# Patient Record
Sex: Female | Born: 1970 | Race: White | Hispanic: No | Marital: Single | Smoking: Current every day smoker
Health system: Southern US, Community
[De-identification: ages and names within clinical notes are randomized; demographics above are authoritative.]

## PROBLEM LIST (undated history)

## (undated) HISTORY — PX: ABDOMINAL HYSTERECTOMY: SHX81

## (undated) HISTORY — PX: HERNIA REPAIR: SHX51

---

## 1999-02-13 ENCOUNTER — Emergency Department (HOSPITAL_COMMUNITY): Admission: EM | Admit: 1999-02-13 | Discharge: 1999-02-13 | Payer: Self-pay | Admitting: Emergency Medicine

## 1999-02-13 ENCOUNTER — Encounter: Payer: Self-pay | Admitting: Emergency Medicine

## 2011-08-22 ENCOUNTER — Emergency Department (INDEPENDENT_AMBULATORY_CARE_PROVIDER_SITE_OTHER): Payer: BC Managed Care – PPO

## 2011-08-22 ENCOUNTER — Emergency Department (HOSPITAL_BASED_OUTPATIENT_CLINIC_OR_DEPARTMENT_OTHER)
Admission: EM | Admit: 2011-08-22 | Discharge: 2011-08-22 | Disposition: A | Discharge status: Home / Self Care | Payer: BC Managed Care – PPO | Attending: Emergency Medicine | Admitting: Emergency Medicine

## 2011-08-22 ENCOUNTER — Encounter (HOSPITAL_BASED_OUTPATIENT_CLINIC_OR_DEPARTMENT_OTHER): Payer: Self-pay

## 2011-08-22 DIAGNOSIS — R1110 Vomiting, unspecified: Secondary | ICD-10-CM | POA: Insufficient documentation

## 2011-08-22 DIAGNOSIS — R51 Headache: Secondary | ICD-10-CM | POA: Insufficient documentation

## 2011-08-22 DIAGNOSIS — H53149 Visual discomfort, unspecified: Secondary | ICD-10-CM | POA: Insufficient documentation

## 2011-08-22 DIAGNOSIS — H539 Unspecified visual disturbance: Secondary | ICD-10-CM

## 2011-08-22 DIAGNOSIS — F17200 Nicotine dependence, unspecified, uncomplicated: Secondary | ICD-10-CM | POA: Insufficient documentation

## 2011-08-22 DIAGNOSIS — H538 Other visual disturbances: Secondary | ICD-10-CM | POA: Insufficient documentation

## 2011-08-22 NOTE — Discharge Instructions (Signed)
 Headache, General, Unknown Cause   The specific cause of your headache may not have been found today. There are many causes and types of headache. A few common ones are:   Tension headache.   Migraine.   Infections (examples: dental and sinus infections).   Bone and/or joint problems in the neck or jaw.   Depression.   Eye problems.  These headaches are not life threatening.   Headaches can sometimes be diagnosed by a patient history and a physical exam. Sometimes, lab and imaging studies (such as x-ray and/or CT scan) are used to rule out more serious problems. In some cases, a spinal tap (lumbar puncture) may be requested. There are many times when your exam and tests may be normal on the first visit even when there is a serious problem causing your headaches. Because of that, it is very important to follow up with your doctor or local clinic for further evaluation.   FINDING OUT THE RESULTS OF TESTS   If a radiology test was performed, a radiologist will review your results.   You will be contacted by the emergency department or your physician if any test results require a change in your treatment plan.   Not all test results may be available during your visit. If your test results are not back during the visit, make an appointment with your caregiver to find out the results. Do not assume everything is normal if you have not heard from your caregiver or the medical facility. It is important for you to follow up on all of your test results.  HOME CARE INSTRUCTIONS   Keep follow-up appointments with your caregiver, or any specialist referral.   Only take over-the-counter or prescription medicines for pain, discomfort, or fever as directed by your caregiver.   Biofeedback, massage, or other relaxation techniques may be helpful.   Ice packs or heat applied to the head and neck can be used. Do this three to four times per day, or as needed.   Call your doctor if you have any questions or concerns.   If you smoke, you  should quit.  SEEK MEDICAL CARE IF:   You develop problems with medications prescribed.   You do not respond to or obtain relief from medications.   You have a change from the usual headache.   You develop nausea or vomiting.  SEEK IMMEDIATE MEDICAL CARE IF:   If your headache becomes severe.   You have an unexplained oral temperature above 102 F (38.9 C), or as your caregiver suggests.   You have a stiff neck.   You have loss of vision.   You have muscular weakness.   You have loss of muscular control.   You develop severe symptoms different from your first symptoms.   You start losing your balance or have trouble walking.   You feel faint or pass out.  MAKE SURE YOU:   Understand these instructions.   Will watch your condition.   Will get help right away if you are not doing well or get worse.  Document Released: 05/19/2005 Document Revised: 05/08/2011 Document Reviewed: 01/06/2008   [facility] Patient Information 2012 [place], [place].

## 2011-08-22 NOTE — ED Provider Notes (Signed)
History     CSN: [id number]  Arrival date & time 08/22/11  2018   First MD Initiated Contact with Patient 08/22/11 2047      Chief Complaint  Patient presents with  . Headache    (Consider location/radiation/quality/duration/timing/severity/associated sxs/prior treatment) HPI Comments: Patient presents today with headache. She has a history of recurrent tension-type headaches. She typically has about 4-5 headaches per week and takes [facility] powders for these which is generally 3 times per week. Today she woke up with a dull, aching headache, which gradually got worse over the next several hours and then when she was at work had an intense worsening of the headache with pain across both sides of her head associated with vomiting and blurry vision and photophobia. She took a BC powder with this and at this point. She has no headache at all. She has some intermittent blurry vision. No nausea no headache no dizziness. She's never had a headache quite like this before although she, says she's been under a lot of stress over last couple months and does have a high caffeine intake  Patient is a 41 y.o. female presenting with headaches. The history is provided by the patient.  Headache  This is a new problem. Pertinent negatives include no fever, no shortness of breath, no nausea and no vomiting.    History reviewed. No pertinent past medical history.  Past Surgical History  Procedure Date  . Abdominal hysterectomy   . Hernia repair     No family history on file.  History  Substance Use Topics  . Smoking status: Current Everyday Smoker  . Smokeless tobacco: Not on file  . Alcohol Use: Yes    OB History    Grav Para Term Preterm Abortions TAB SAB Ect Mult Living                  Review of Systems  Constitutional: Negative for fever, chills, diaphoresis and fatigue.  HENT: Negative for congestion, rhinorrhea, sneezing, neck pain and neck stiffness.   Eyes: Positive for visual  disturbance.  Respiratory: Negative for cough, chest tightness and shortness of breath.   Cardiovascular: Negative for chest pain and leg swelling.  Gastrointestinal: Negative for nausea, vomiting, abdominal pain, diarrhea and blood in stool.  Genitourinary: Negative for frequency, hematuria, flank pain and difficulty urinating.  Musculoskeletal: Negative for back pain and arthralgias.  Skin: Negative for rash.  Neurological: Positive for headaches. Negative for dizziness, seizures, facial asymmetry, speech difficulty, weakness and numbness.  Psychiatric/Behavioral: Negative for confusion, decreased concentration and agitation.    Allergies  Review of patient's allergies indicates no known allergies.  Home Medications   Current Outpatient Rx  Name Route Sig Dispense Refill  . ARTHRITIS STRENGTH BC POWDER PO Oral Take 1 packet by mouth daily as needed. Patient used this medication for her headache.      BP 118/75  Pulse 62  Temp(Src) 97.6 F (36.4 C) (Oral)  Resp 16  Ht 5\' 4"  (1.626 m)  Wt 139 lb (63.05 kg)  BMI 23.86 kg/m2  SpO2 100%  Physical Exam  Constitutional: She is oriented to person, place, and time. She appears well-developed and well-nourished.  HENT:  Head: Normocephalic and atraumatic.  Eyes: Conjunctivae and EOM are normal. Pupils are equal, round, and reactive to light.       Normal funduscopic exam  Neck: Normal range of motion. Neck supple.       No meningeal signs  Cardiovascular: Normal rate, regular rhythm and  normal heart sounds.   Pulmonary/Chest: Effort normal and breath sounds normal. No respiratory distress. She has no wheezes. She has no rales. She exhibits no tenderness.  Abdominal: Soft. Bowel sounds are normal. There is no tenderness. There is no rebound and no guarding.  Musculoskeletal: Normal range of motion. She exhibits no edema.  Lymphadenopathy:    She has no cervical adenopathy.  Neurological: She is alert and oriented to person, place,  and time. She has normal strength. No cranial nerve deficit or sensory deficit. Coordination normal. GCS eye subscore is 4. GCS verbal subscore is 5. GCS motor subscore is 6.  Skin: Skin is warm and dry. No rash noted.  Psychiatric: She has a normal mood and affect.    ED Course  Procedures (including critical care time)  No results found for this or any previous visit. Ct Head Wo Contrast  08/22/2011  *RADIOLOGY REPORT*  Clinical Data: Headache, light sensitivity, visual disturbances, pressure behind ears  CT HEAD WITHOUT CONTRAST  Technique:  Contiguous axial images were obtained from the base of the skull through the vertex without contrast.  Comparison: None  Findings: Normal ventricular morphology. No midline shift or mass effect. Normal appearance of brain parenchyma. No intracranial hemorrhage, mass lesion, or acute infarction. Visualized paranasal sinuses and mastoid air cells clear. Bones unremarkable.  IMPRESSION: No acute intracranial abnormalities.  Original Report Authenticated By: Lollie Marrow, M.D.       1. Headache       MDM  Pt with chronic tension type headaches.  Today with sudden, worsening headache.  CT neg.   Do not feel that LP is indicated now given that pt has absolutely no pain at all now and we did not give her any meds.  She did take a BC powder earlier, but nothing else.  Given this, I feel the likelihood of meningitis or SAH is low.  I do feel that she probably has a component of rebound headaches and needs better management of her headaches.  Will refer her to neurology.        Rolan Bucco, MD 08/22/11 2207

## 2011-08-22 NOTE — ED Notes (Addendum)
Woke this afternoon 150pm with HA-works 2nd shift-increased pain while at work-took bc powder-no change in HA-was picked up at work-pain better approx 8pm-c/o cont'd light sensitivity, visual disturbance-"pressure" behind ears-states "nothing major now"-was sent by [employer]

## 2011-11-10 ENCOUNTER — Ambulatory Visit (INDEPENDENT_AMBULATORY_CARE_PROVIDER_SITE_OTHER): Payer: BC Managed Care – PPO | Admitting: Emergency Medicine

## 2011-11-10 ENCOUNTER — Ambulatory Visit: Payer: BC Managed Care – PPO

## 2011-11-10 VITALS — BP 110/77 | HR 84 | Temp 98.2°F | Resp 16 | Ht 63.25 in | Wt 137.8 lb

## 2011-11-10 DIAGNOSIS — S6710XA Crushing injury of unspecified finger(s), initial encounter: Secondary | ICD-10-CM

## 2011-11-10 DIAGNOSIS — M79609 Pain in unspecified limb: Secondary | ICD-10-CM

## 2011-11-10 DIAGNOSIS — S6700XA Crushing injury of unspecified thumb, initial encounter: Secondary | ICD-10-CM
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
## 2013-02-22 ENCOUNTER — Other Ambulatory Visit: Payer: Self-pay | Admitting: Neurosurgery

## 2013-02-22 DIAGNOSIS — M5126 Other intervertebral disc displacement, lumbar region: Secondary | ICD-10-CM

## 2013-02-25 ENCOUNTER — Ambulatory Visit
Admission: RE | Admit: 2013-02-25 | Discharge: 2013-02-25 | Disposition: A | Discharge status: Home / Self Care | Payer: Managed Care, Other (non HMO) | Source: Ambulatory Visit | Attending: Neurosurgery | Admitting: Neurosurgery

## 2013-02-25 DIAGNOSIS — M5126 Other intervertebral disc displacement, lumbar region: Secondary | ICD-10-CM

## 2013-02-25 MED ORDER — METHYLPREDNISOLONE ACETATE 40 MG/ML INJ SUSP (RADIOLOG
120.0000 mg | Freq: Once | INTRAMUSCULAR | Status: AC
  Administered 2013-02-25: 120 mg via EPIDURAL

## 2013-02-25 MED ORDER — IOHEXOL 180 MG/ML  SOLN
1.0000 mL | Freq: Once | INTRAMUSCULAR | Status: AC | PRN
  Administered 2013-02-25: 1 mL via EPIDURAL

## 2013-03-08 ENCOUNTER — Other Ambulatory Visit: Payer: Self-pay | Admitting: Neurosurgery

## 2013-03-08 ENCOUNTER — Other Ambulatory Visit: Payer: Self-pay | Admitting: Pediatrics

## 2013-03-08 DIAGNOSIS — M5126 Other intervertebral disc displacement, lumbar region: Secondary | ICD-10-CM

## 2013-03-11 ENCOUNTER — Ambulatory Visit
Admission: RE | Admit: 2013-03-11 | Discharge: 2013-03-11 | Disposition: A | Discharge status: Home / Self Care | Payer: Managed Care, Other (non HMO) | Source: Ambulatory Visit | Attending: Neurosurgery | Admitting: Neurosurgery

## 2013-03-11 DIAGNOSIS — M5126 Other intervertebral disc displacement, lumbar region: Secondary | ICD-10-CM

## 2013-03-11 MED ORDER — IOHEXOL 180 MG/ML  SOLN
1.0000 mL | Freq: Once | INTRAMUSCULAR | Status: AC | PRN
  Administered 2013-03-11: 1 mL via EPIDURAL

## 2013-03-11 MED ORDER — METHYLPREDNISOLONE ACETATE 40 MG/ML INJ SUSP (RADIOLOG
120.0000 mg | Freq: Once | INTRAMUSCULAR | Status: AC
  Administered 2013-03-11: 120 mg via EPIDURAL

## 2013-06-22 IMAGING — CT CT HEAD W/O CM
1 series · 16 of 30 positions shown, 20 images · non-contrast
Comparison: None

CLINICAL DATA: Headache, light sensitivity, visual disturbances,
pressure behind ears

CT HEAD WITHOUT CONTRAST
TECHNIQUE: Contiguous axial images were obtained from the base of
the skull through the vertex without contrast.

[Series 2: head 4.8 h37s · axial · 0.44mm/px · z∈[-131,+6]mm · 16 of 32 slices shown, 20 images]
[im 2/32  brain]
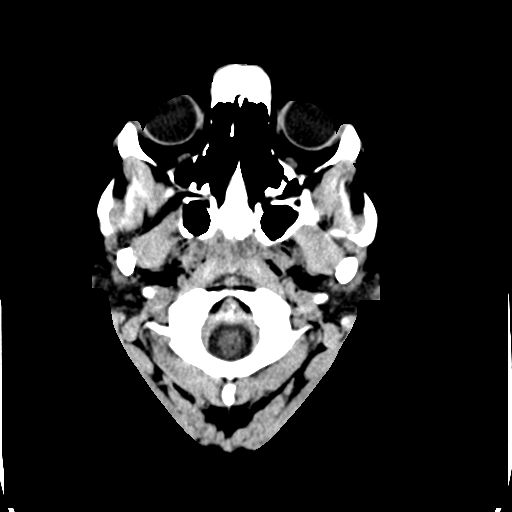
[im 2/32  bone]
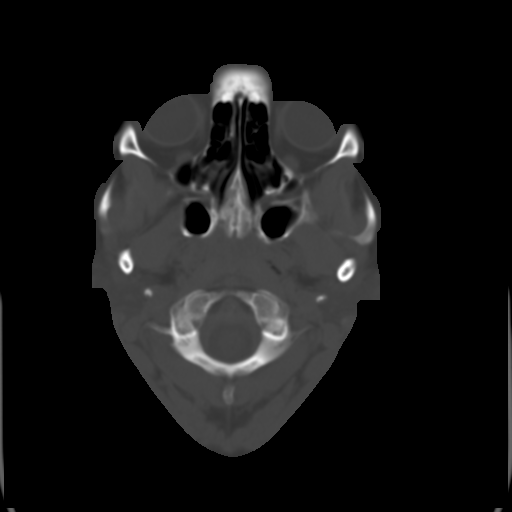
[im 4/32  brain]
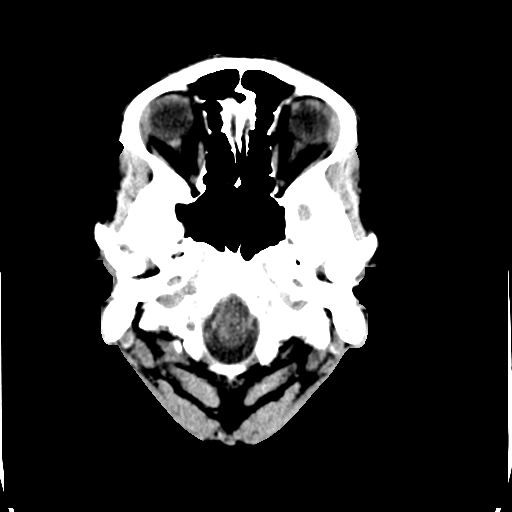
[im 6/32  brain]
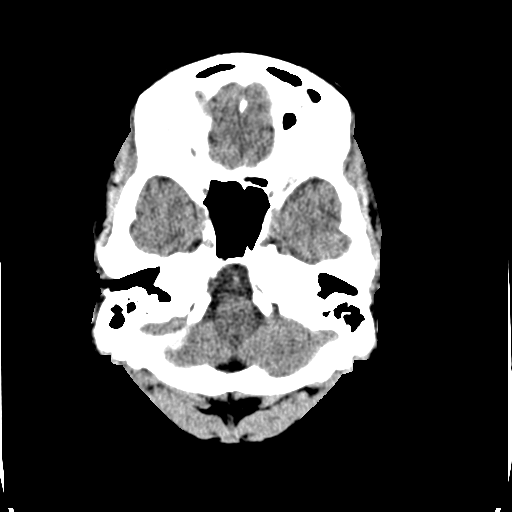
[im 8/32  brain]
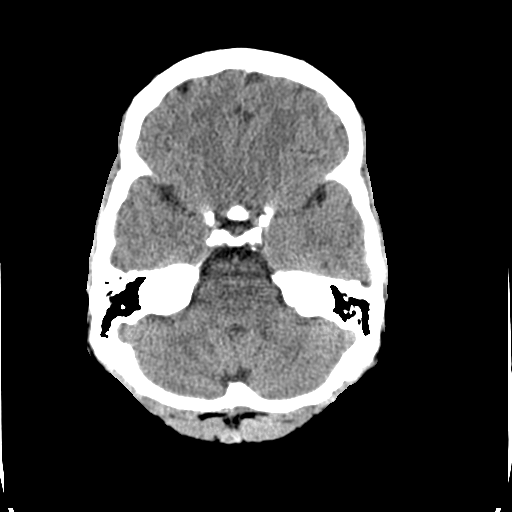
[im 9/32  brain]
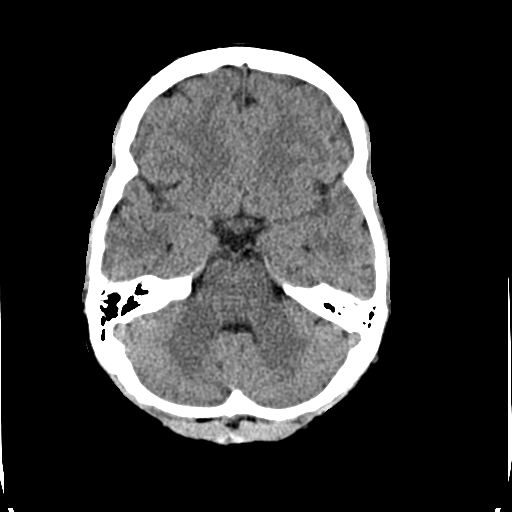
[im 9/32  bone]
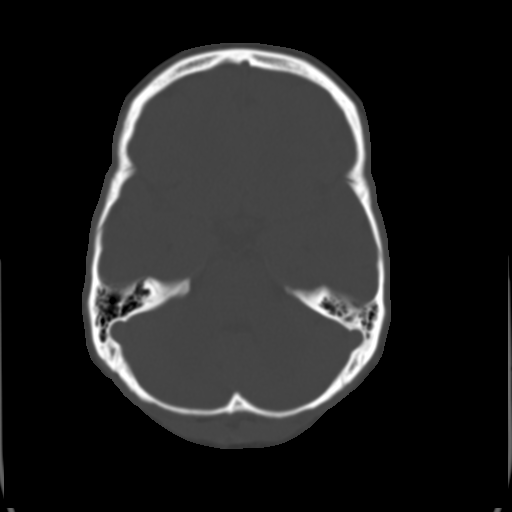
[im 11/32  brain]
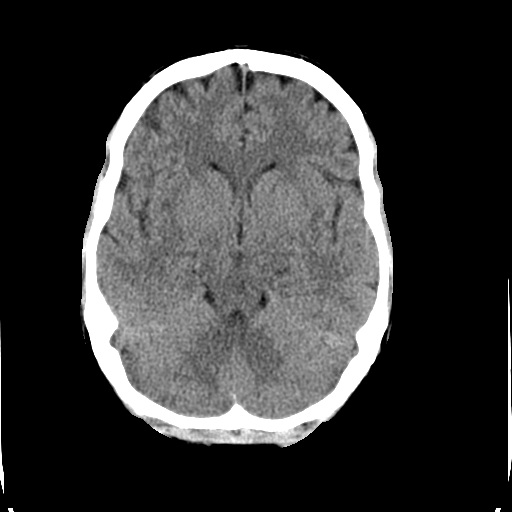
[im 13/32  brain]
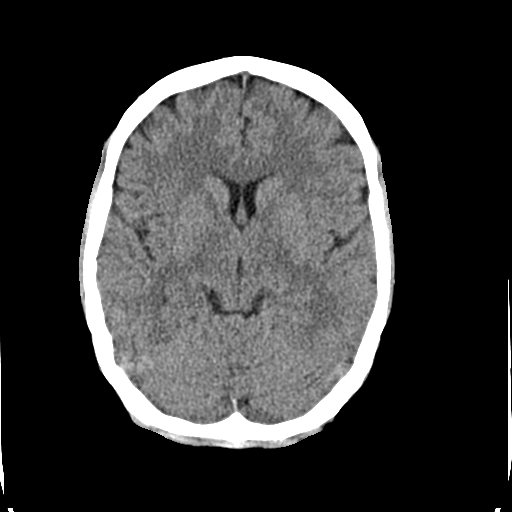
[im 15/32  brain]
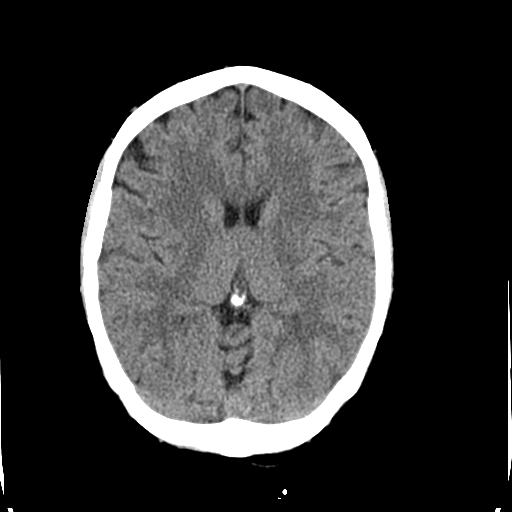
[im 17/32  brain]
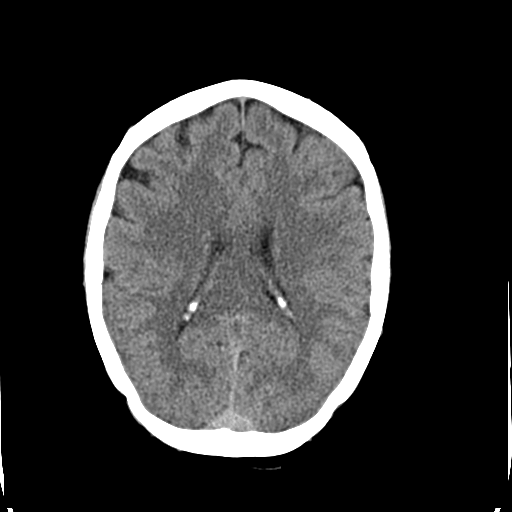
[im 17/32  bone]
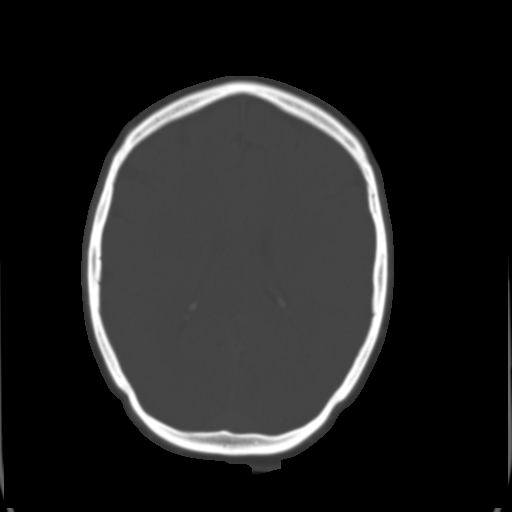
[im 19/32  brain]
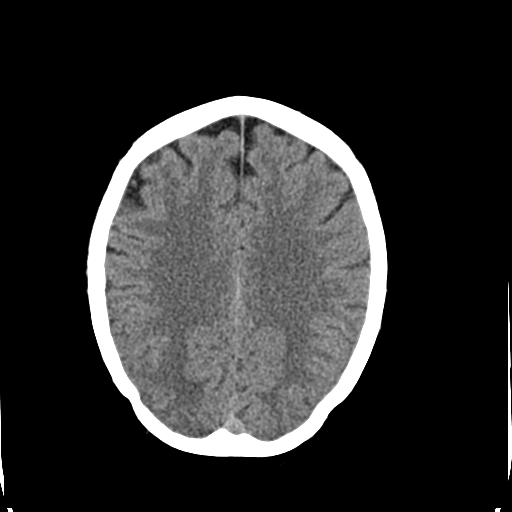
[im 21/32  brain]
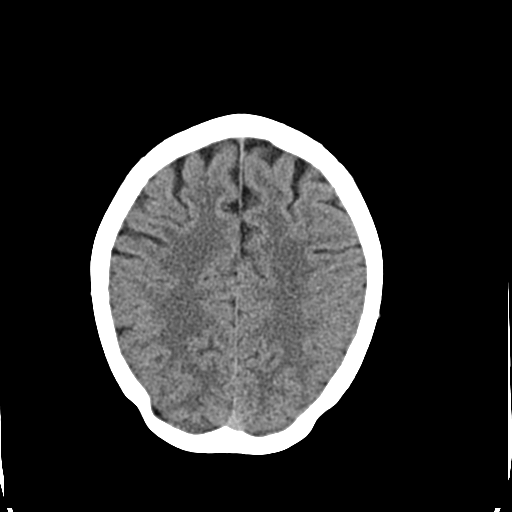
[im 23/32  brain]
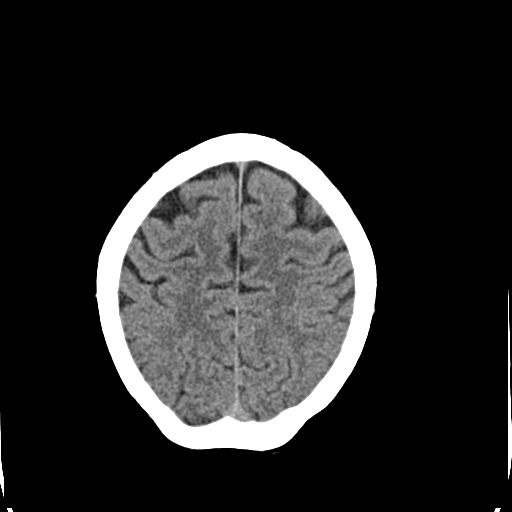
[im 24/32  brain]
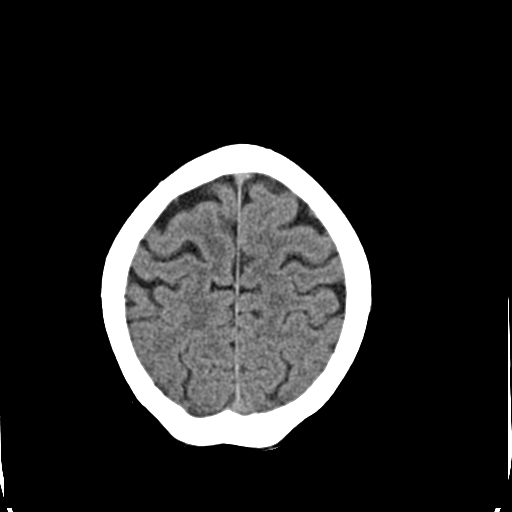
[im 24/32  bone]
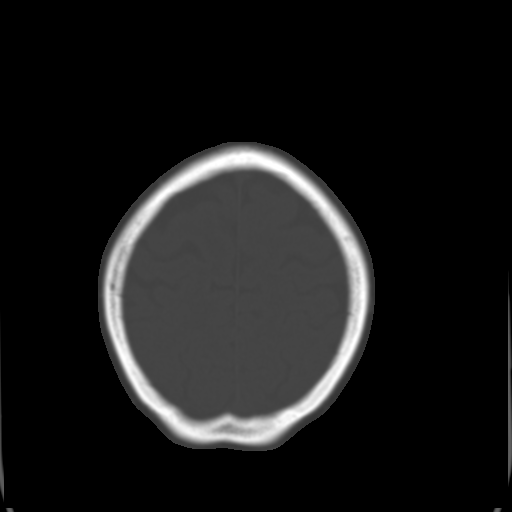
[im 26/32  brain]
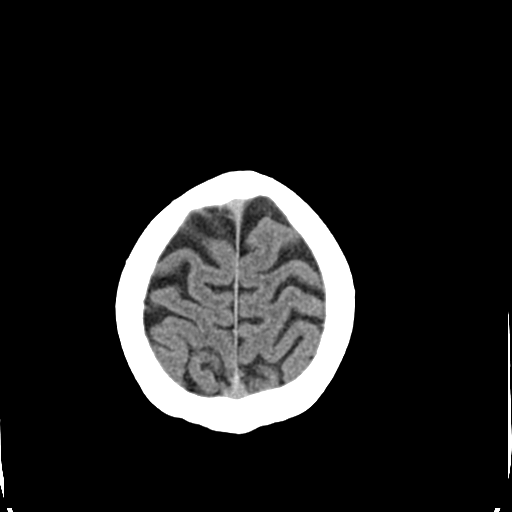
[im 28/32  brain]
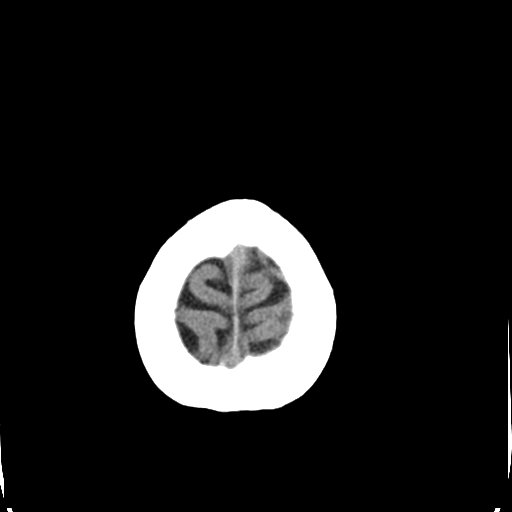
[im 30/32  brain]
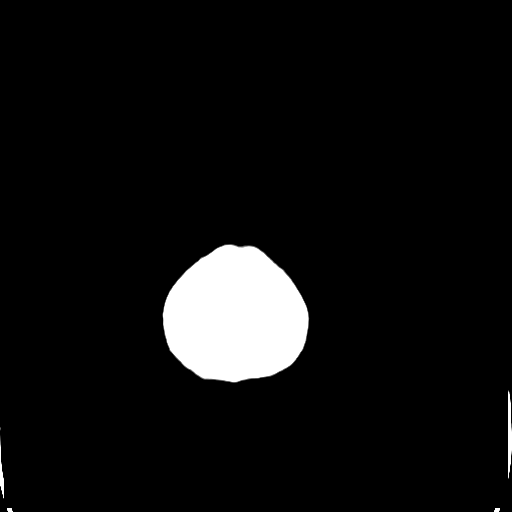

[16 of 30 positions shown; findings below may reference images not displayed]

FINDINGS: Normal ventricular morphology.
No midline shift or mass effect.
Normal appearance of brain parenchyma.
No intracranial hemorrhage, mass lesion, or acute infarction.
Visualized paranasal sinuses and mastoid air cells clear.
Bones unremarkable.
IMPRESSION: No acute intracranial abnormalities.

## 2015-08-07 ENCOUNTER — Encounter: Payer: Self-pay | Admitting: Rehabilitative and Restorative Service Providers"

## 2015-08-07 ENCOUNTER — Ambulatory Visit (INDEPENDENT_AMBULATORY_CARE_PROVIDER_SITE_OTHER): Payer: BLUE CROSS/BLUE SHIELD | Admitting: Rehabilitative and Restorative Service Providers"

## 2015-08-07 DIAGNOSIS — M2560 Stiffness of unspecified joint, not elsewhere classified: Secondary | ICD-10-CM

## 2015-08-07 DIAGNOSIS — M623 Immobility syndrome (paraplegic): Secondary | ICD-10-CM | POA: Diagnosis not present

## 2015-08-07 DIAGNOSIS — M79605 Pain in left leg: Principal | ICD-10-CM

## 2015-08-07 DIAGNOSIS — M545 Low back pain: Secondary | ICD-10-CM | POA: Diagnosis not present

## 2015-08-07 DIAGNOSIS — R531 Weakness: Secondary | ICD-10-CM

## 2015-08-07 DIAGNOSIS — Z7409 Other reduced mobility: Secondary | ICD-10-CM

## 2015-08-07 DIAGNOSIS — R29898 Other symptoms and signs involving the musculoskeletal system: Secondary | ICD-10-CM | POA: Diagnosis not present

## 2015-08-07 NOTE — Patient Instructions (Signed)
Abdominal Bracing With Pelvic Floor (Hook-Lying)    With neutral spine, tighten pelvic floor and abdominals sucking belly button to back bone; tighten muscles in back at waist. Hold 10 sec  Repeat __10_ times. Do _several__ times a day. Progress to do this in sitting; standing; walking; and with functional activities.   Trunk: Prone Extension (Press-Ups)    Lie on stomach on firm, flat surface. Relax bottom and legs. Raise chest in air with elbows straight. Keep hips flat on surface, sag stomach. Hold _2-3___ seconds. Repeat _10___ times. Do __2-4__ sessions per day. CAUTION: Movement should be gentle and slow.   Posture Tips DO: - stand tall and erect - keep chin tucked in - keep head and shoulders in alignment - check posture regularly in mirror or large window - pull head back against headrest in car seat;  Change your position often.  Sit with lumbar support. DON'T: - slouch or slump while watching TV or reading - sit, stand or lie in one position  for too long;  Sitting is especially hard on the spine so if you sit at a desk/use the computer, then stand up often!   Sleeping on Back  Place pillow under knees. A pillow with cervical support and a roll around waist are also helpful. Copyright  VHI. All rights reserved.  Sleeping on Side Place pillow between knees. Use cervical support under neck and a roll around waist as needed. Copyright  VHI. All rights reserved.   Sleeping on Stomach   If this is the only desirable sleeping position, place pillow under lower legs, and under stomach or chest as needed.  Posture - Sitting   Sit upright, head facing forward. Try using a roll to support lower back. Keep shoulders relaxed, and avoid rounded back. Keep hips level with knees. Avoid crossing legs for long periods. Stand to Sit / Sit to Stand   To sit: Bend knees to lower self onto front edge of chair, then scoot back on seat. To stand: Reverse sequence by placing one foot  forward, and scoot to front of seat. Use rocking motion to stand up.   Work Height and Reach  Ideal work height is no more than 2 to 4 inches below elbow level when standing, and at elbow level when sitting. Reaching should be limited to arm's length, with elbows slightly bent.  Bending  Bend at hips and knees, not back. Keep feet shoulder-width apart.    Posture - Standing   Good posture is important. Avoid slouching and forward head thrust. Maintain curve in low back and align ears over shoul- ders, hips over ankles.  Alternating Positions   Alternate tasks and change positions frequently to reduce fatigue and muscle tension. Take rest breaks. Computer Work   Position work to Art gallery managerface forward. Use proper work and seat height. Keep shoulders back and down, wrists straight, and elbows at right angles. Use chair that provides full back support. Add footrest and lumbar roll as needed.  Getting Into / Out of Car  Lower self onto seat, scoot back, then bring in one leg at a time. Reverse sequence to get out.  Dressing  Lie on back to pull socks or slacks over feet, or sit and bend leg while keeping back straight.    Housework - Sink  Place one foot on ledge of cabinet under sink when standing at sink for prolonged periods.   Pushing / Pulling  Pushing is preferable to pulling. Keep back in proper alignment,  and use leg muscles to do the work.  Deep Squat   Squat and lift with both arms held against upper trunk. Tighten stomach muscles without holding breath. Use smooth movements to avoid jerking.  Avoid Twisting   Avoid twisting or bending back. Pivot around using foot movements, and bend at knees if needed when reaching for articles.  Carrying Luggage   Distribute weight evenly on both sides. Use a cart whenever possible. Do not twist trunk. Move body as a unit.   Lifting Principles .Maintain proper posture and head alignment. .Slide object as close as possible  before lifting. .Move obstacles out of the way. .Test before lifting; ask for help if too heavy. .Tighten stomach muscles without holding breath. .Use smooth movements; do not jerk. .Use legs to do the work, and pivot with feet. .Distribute the work load symmetrically and close to the center of trunk. .Push instead of pull whenever possible.   Ask For Help   Ask for help and delegate to others when possible. Coordinate your movements when lifting together, and maintain the low back curve.  Log Roll   Lying on back, bend left knee and place left arm across chest. Roll all in one movement to the right. Reverse to roll to the left. Always move as one unit. Housework - Sweeping  Use long-handled equipment to avoid stooping.   Housework - Wiping  Position yourself as close as possible to reach work surface. Avoid straining your back.  Laundry - Unloading Wash   To unload small items at bottom of washer, lift leg opposite to arm being used to reach.  Gardening - Raking  Move close to area to be raked. Use arm movements to do the work. Keep back straight and avoid twisting.     Cart  When reaching into cart with one arm, lift opposite leg to keep back straight.   Getting Into / Out of Bed  Lower self to lie down on one side by raising legs and lowering head at the same time. Use arms to assist moving without twisting. Bend both knees to roll onto back if desired. To sit up, start from lying on side, and use same move-ments in reverse. Housework - Vacuuming  Hold the vacuum with arm held at side. Step back and forth to move it, keeping head up. Avoid twisting.   Laundry - Armed forces training and education officer so that bending and twisting can be avoided.   Laundry - Unloading Dryer  Squat down to reach into clothes dryer or use a reacher.  Gardening - Weeding / Psychiatric nurse or Kneel. Knee pads may be helpful.                     TENS  UNIT: This is helpful for muscle pain and spasm.   Search and Purchase a TENS 7000 2nd edition at www.tenspros.com. It should be less than $30.     TENS unit instructions: Do not shower or bathe with the unit on Turn the unit off before removing electrodes or batteries If the electrodes lose stickiness add a drop of water to the electrodes after they are disconnected from the unit and place on plastic sheet. If you continued to have difficulty, call the TENS unit company to purchase more electrodes. Do not apply lotion on the skin area prior to use. Make sure the skin is clean and dry as this will help prolong the life of the electrodes. After use, always  check skin for unusual red areas, rash or other skin difficulties. If there are any skin problems, does not apply electrodes to the same area. Never remove the electrodes from the unit by pulling the wires. Do not use the TENS unit or electrodes other than as directed. Do not change electrode placement without consultating your therapist or physician. Keep 2 fingers with between each electrode.

## 2015-08-07 NOTE — Therapy (Signed)
[facility] [place] [address] [place], [place], [address] Phone: [phone]   Fax:  [phone]  Physical Therapy Evaluation  Patient Details  Name: Catherine Hahn MRN: [id number] Date of Birth: September 19, 1970 Referring Provider: Dr. Casper Harrison  Encounter Date: 08/07/2015      PT End of Session - 08/07/15 0857    Visit Number 1   Number of Visits 12   Date for PT Re-Evaluation 09/18/15   PT Start Time 0857   PT Stop Time 1001   PT Time Calculation (min) 64 min   Activity Tolerance Patient tolerated treatment well      History reviewed. No pertinent past medical history.  Past Surgical History  Procedure Laterality Date  . Abdominal hysterectomy    . Hernia repair      There were no vitals filed for this visit.  Visit Diagnosis:  LBP radiating to left leg  Stiffness due to immobility  Weakness of left lower extremity  Decreased strength, endurance, and mobility      Subjective Assessment - 08/07/15 0858    Subjective Patient reports that she underwent lumbar laminectomy L3.4.5 11/14 with come continued pain and problems. She lost her insurance and was unable to continue rehab. She experienced recurrent pain in LB and underwent repeat MRI which showed buldging lumbar discs. Has noted pain in the LB and across the buttocks for ~ 1 month.    Pertinent History Lumbar laminectomy 11/14; hysteroctomy; MRI showed buldging discs; hernia repair with mesh -  ingunial and groin area   How long can you sit comfortably? 15 min    How long can you stand comfortably? 5-10 min    How long can you walk comfortably? 5 min    Diagnostic tests MRI and xrays    Patient Stated Goals to be able to walk and ewxercise; bend; move without pain    Currently in Pain? Yes   Pain Score 4    Pain Location Back   Pain Orientation Lower;Left   Pain Descriptors / Indicators Sharp;Stabbing;Aching   Pain Type Chronic pain   Pain Radiating Towards into  buttocks and posterior thigh to knee on Lt    Pain Onset More than a month ago   Pain Frequency Intermittent   Aggravating Factors  prolonged sitting; standing; walking:  lifting: bending    Pain Relieving Factors lying down with pillow under the back of her legs; Advil            [facility] PT Assessment - 08/07/15 0001    Assessment   Medical Diagnosis Chronic LBP    Referring Provider Dr. Casper Harrison   Onset Date/Surgical Date 07/04/15   Hand Dominance Left   Next MD Visit no return scheduled    Prior Therapy PT 2015    Precautions   Precautions None   Balance Screen   Has the patient fallen in the past 6 months No   Has the patient had a decrease in activity level because of a fear of falling?  No   Is the patient reluctant to leave their home because of a fear of falling?  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    Leisure household chores   Observation/Other Assessments   Focus on Therapeutic Outcomes (FOTO)  64% limitation  Sensation   Additional Comments WFL's per pt report    Posture/Postural Control   Posture Comments head forward; shoulders rounded and elevated; increased thoracic kyphosis; Rt hip shifted to Rt    AROM   Overall AROM Comments hips WFL's bilat    Lumbar Flexion 50%   Lumbar Extension 5%   Lumbar - Right Side Bend 15%   Lumbar - Left Side Bend 20%   Lumbar - Right Rotation 10%   Lumbar - Left Rotation 10%   Strength   Right Hip Flexion 5/5   Right Hip Extension 5/5   Right Hip ABduction 5/5   Right Hip ADduction 5/5   Left Hip Flexion 4+/5   Left Hip Extension 4+/5   Left Hip ABduction 4+/5   Left Hip ADduction --  5-/5   Right/Left Knee --  5/5 bilat knee flex/ext    Right/Left Ankle --  5/5 DF/PF bilat    Flexibility   Hamstrings Rt 75 deg; Lt 70 deg   Quadriceps heel ~  4 inches form buttock pull Lt quad; pull Lt groin on Rt    ITB tight bilat Lt > Rt    Piriformis tight Lt    Palpation   Spinal mobility pain with CPA L3/4/5; Lt UPA L 3/4/5; Rt L4    Palpation comment muscular tightness through Lt paraspinals; QL; lats; glut medius; piriformis                    [facility] Adult PT Treatment/Exercise - 08/07/15 0001    Self-Care   Self-Care --  transitional movements sit to stand and reverse   Lumbar Exercises: Stretches   Press Ups --  2-3 sec hold x 10   Lumbar Exercises: Supine   Ab Set --  3 part core 10 sec x 10 fatigued quickly    Moist Heat Therapy   Number Minutes Moist Heat 15 Minutes   Moist Heat Location Lumbar Spine;Hip  Lt   Programme researcher, broadcasting/film/video Location bilat L4/5 Lt hip/buttock    Electrical Stimulation Action IFC   Electrical Stimulation Parameters to tolerance   Electrical Stimulation Goals Pain;Tone                PT Education - 08/07/15 [phone]    Education provided Yes   Education Details initiated spine care education; HEP; TENS info    Person(s) Educated Patient   Methods Explanation;Demonstration;Tactile cues;Verbal cues;Handout   Comprehension Verbalized understanding;Returned demonstration;Verbal cues required;Tactile cues required             PT Long Term Goals - 08/07/15 1028    PT LONG TERM GOAL #1   Title Improve trunk mobilty and ROM to 70% of normal range throughout 09/18/15   Time 6   Period Weeks   Status New   PT LONG TERM GOAL #2   Title Increased LE strength to 4+/5 to 5/5 throughout 09/18/15   Time 6   Period Weeks   Status New   PT LONG TERM GOAL #3   Title Increase functional activity tolerance allowing pt to sit/stand/walk 30-60 min without increased symptoms 09/18/15   Time 6   Period Weeks   Status New   PT LONG TERM GOAL #4   Title I in HEP 09/18/15   Time 6   Period Weeks   Status New   PT LONG TERM GOAL #5   Title Improve FOTO to </= 41%  limitation 09/18/15   Time 6  Period Weeks   Status New               Plan - 08/07/15 1016    Clinical Impression Statement Catherine Hahn presents with history of chronic LBP s/plumbar laminectomy 2014 with recurrent flare up of symptoms in 2015-16. Most recent exacerbation ~ 1 month ago. She has limited trunk mobility and ROM; muscular tighenss to paipation; decreased strength; poor core stability; poor posture and body mechanics; limited functional activity endurance and tolerance. pt will benefit form PT to address deficits identified.     Pt will benefit from skilled therapeutic intervention in order to improve on the following deficits Improper body mechanics;Postural dysfunction;Increased fascial restricitons;Decreased range of motion;Decreased mobility;Decreased strength;Decreased endurance;Decreased activity tolerance;Pain   Rehab Potential Good   PT Frequency 2x / week   PT Duration 6 weeks   PT Treatment/Interventions Patient/family education;ADLs/Self Care Home Management;Therapeutic activities;Therapeutic exercise;Neuromuscular re-education;Manual techniques;Dry needling;Electrical Stimulation;Cryotherapy;Moist Heat;Ultrasound   PT Next Visit Plan hamstring and piriformis stretching; core stabilization; manual work including spinal mobs lumbar; modalities as indicated; consider TDN as indicated; continue spine care education    PT Home Exercise Plan spine care initiated; HEP; TENS unit info   Consulted and Agree with Plan of Care Patient         Problem List There are no active problems to display for this patient.   Niels Cranshaw Rober MinionP Keishia Ground PT, MPH  08/07/2015, 10:39 AM  [facility] [address] [place], [place], [address] Phone: [phone]   Fax:  [phone]  Name: Catherine Hahn MRN: [id number] Date of Birth: 07/10/1970

## 2015-08-10 ENCOUNTER — Encounter: Payer: Managed Care, Other (non HMO) | Admitting: Rehabilitative and Restorative Service Providers"

## 2015-08-14 ENCOUNTER — Ambulatory Visit (INDEPENDENT_AMBULATORY_CARE_PROVIDER_SITE_OTHER): Payer: BLUE CROSS/BLUE SHIELD | Admitting: Rehabilitative and Restorative Service Providers"

## 2015-08-14 ENCOUNTER — Encounter: Payer: Self-pay | Admitting: Rehabilitative and Restorative Service Providers"

## 2015-08-14 DIAGNOSIS — R531 Weakness: Secondary | ICD-10-CM

## 2015-08-14 DIAGNOSIS — M545 Low back pain, unspecified: Secondary | ICD-10-CM

## 2015-08-14 DIAGNOSIS — M79605 Pain in left leg: Principal | ICD-10-CM

## 2015-08-14 DIAGNOSIS — M623 Immobility syndrome (paraplegic): Secondary | ICD-10-CM | POA: Diagnosis not present

## 2015-08-14 DIAGNOSIS — R29898 Other symptoms and signs involving the musculoskeletal system: Secondary | ICD-10-CM

## 2015-08-14 DIAGNOSIS — M2560 Stiffness of unspecified joint, not elsewhere classified: Secondary | ICD-10-CM

## 2015-08-14 DIAGNOSIS — Z7409 Other reduced mobility: Secondary | ICD-10-CM

## 2015-08-14 NOTE — Patient Instructions (Addendum)
KNEE: Quadriceps - Prone    Place strap around ankle. Bring ankle toward buttocks. Press hip into surface. Hold _30__ seconds. _3_ reps per set, __2-3_ times per day   HIP: Hamstrings - Supine    Place strap around foot. Raise leg up,bend opposite knee. Hold __30_ seconds. __3_ reps per set, __2-3_ times per day    TENS UNIT: This is helpful for muscle pain and spasm.   Search and Purchase a TENS 7000 2nd edition at www.tenspros.com. It should be less than $30.     TENS unit instructions: Do not shower or bathe with the unit on Turn the unit off before removing electrodes or batteries If the electrodes lose stickiness add a drop of water to the electrodes after they are disconnected from the unit and place on plastic sheet. If you continued to have difficulty, call the TENS unit company to purchase more electrodes. Do not apply lotion on the skin area prior to use. Make sure the skin is clean and dry as this will help prolong the life of the electrodes. After use, always check skin for unusual red areas, rash or other skin difficulties. If there are any skin problems, does not apply electrodes to the same area. Never remove the electrodes from the unit by pulling the wires. Do not use the TENS unit or electrodes other than as directed. Do not change electrode placement without consultating your therapist or physician. Keep 2 fingers with between each electrode.

## 2015-08-14 NOTE — Therapy (Signed)
[facility] [place] [address] [place], [place], [address] Phone: [phone]   Fax:  [phone]  Physical Therapy Treatment  Patient Details  Name: ELBIA PARO MRN: [id number] Date of Birth: [birth year] Referring Provider: Dr. Casper Harrison  Encounter Date: 08/14/2015      PT End of Session - 08/14/15 1016    Visit Number 2   Number of Visits 12   Date for PT Re-Evaluation 09/18/15   PT Start Time 1016   PT Stop Time 1112   PT Time Calculation (min) 56 min   Activity Tolerance Patient tolerated treatment well      History reviewed. No pertinent past medical history.  Past Surgical History  Procedure Laterality Date  . Abdominal hysterectomy    . Hernia repair      There were no vitals filed for this visit.  Visit Diagnosis:  LBP radiating to left leg  Stiffness due to immobility  Weakness of left lower extremity  Decreased strength, endurance, and mobility      Subjective Assessment - 08/14/15 1016    Subjective Patoient reports that the pain in the LB and buttocks has not changed. She is doing her exercises at home. Pain is always on the Lt in the LB and goes into the Lt buttocks   Currently in Pain? Yes   Pain Score 3    Pain Orientation Lower;Left   Pain Descriptors / Indicators Nagging   Pain Type Chronic pain   Pain Onset More than a month ago   Pain Frequency Intermittent                         [facility] Adult PT Treatment/Exercise - 08/14/15 0001    Self-Care   Self-Care --  transitional movements sit to stand and reverse   Lumbar Exercises: Stretches   Passive Hamstring Stretch 3 reps;30 seconds   Press Ups --  2-3 sec hold x 10   Quad Stretch 3 reps;30 seconds  prone with strap    Lumbar Exercises: Aerobic   Stationary Bike Nustep L5 x 5 min    Lumbar Exercises: Supine   Ab Set --  3 part core 10 sec x 10 fatigued quickly    Moist Heat Therapy   Number Minutes Moist Heat 15  Minutes   Moist Heat Location Lumbar Spine;Hip  Lt   Programme researcher, broadcasting/film/video Location bilat L4/5 Lt hip/buttock    Electrical Stimulation Action IFC   Electrical Stimulation Parameters to tolerance   Electrical Stimulation Goals Pain;Tone   Manual Therapy   Manual therapy comments pt prone with pillow under trunk    Joint Mobilization Grade I/II Lumbar CPA    Soft tissue mobilization lumbar-thoracic musculature bilat Lt > Rt    Myofascial Release lumbar; Lt thoraco-lumbar                 PT Education - 08/14/15 1100    Education provided Yes   Education Details HEP    Person(s) Educated Patient   Methods Explanation;Demonstration;Tactile cues;Verbal cues;Handout   Comprehension Verbalized understanding;Returned demonstration;Verbal cues required;Tactile cues required             PT Long Term Goals - 08/14/15 1306    PT LONG TERM GOAL #1   Title Improve trunk mobilty and ROM to 70% of normal range throughout 09/18/15   Time 6   Period Weeks   Status On-going   PT LONG TERM  GOAL #2   Title Increased LE strength to 4+/5 to 5/5 throughout 09/18/15   Time 6   Period Weeks   Status On-going   PT LONG TERM GOAL #3   Title Increase functional activity tolerance allowing pt to sit/stand/walk 30-60 min without increased symptoms 09/18/15   Time 6   Period Weeks   Status On-going   PT LONG TERM GOAL #4   Title I in HEP 09/18/15   Time 6   Period Weeks   Status On-going   PT LONG TERM GOAL #5   Title Improve FOTO to </= 41% limitation 09/18/15   Time 6   Period Weeks   Status On-going               Plan - 08/14/15 1301    Clinical Impression Statement Misty StanleyLisa experienced some increased pain with exercise but did tolerate them during treatment. She responded well to manual work. Misty StanleyLisa will check on ordereing TENS unit today. Experiences good relief with estim in the clinic. Pt needs continued stretching to address tightness through hips and  LE's as well as core stabilization. No goals accomplished. Second visit.    Pt will benefit from skilled therapeutic intervention in order to improve on the following deficits Improper body mechanics;Postural dysfunction;Increased fascial restricitons;Decreased range of motion;Decreased mobility;Decreased strength;Decreased endurance;Decreased activity tolerance;Pain   Rehab Potential Good   PT Frequency 2x / week   PT Duration 6 weeks   PT Treatment/Interventions Patient/family education;ADLs/Self Care Home Management;Therapeutic activities;Therapeutic exercise;Neuromuscular re-education;Manual techniques;Dry needling;Electrical Stimulation;Cryotherapy;Moist Heat;Ultrasound   PT Next Visit Plan hamstring and piriformis stretching; core stabilization; manual work including spinal mobs lumbar; modalities as indicated; consider TDN as indicated; continue spine care education    PT Home Exercise Plan spine care initiated; HEP; TENS unit info   Consulted and Agree with Plan of Care Patient        Problem List There are no active problems to display for this patient.   Caydyn Sprung Rober MinionP Karmina Zufall PT, MPH  08/14/2015, 1:08 PM  [facility] [address] [place], [place], [address] Phone: [phone]   Fax:  [phone]  Name: Ardyth ManLisa A Silvester MRN: [id number] Date of Birth: 11/10/1970

## 2015-08-17 ENCOUNTER — Encounter: Payer: Managed Care, Other (non HMO) | Admitting: Physical Therapy

## 2015-08-21 ENCOUNTER — Ambulatory Visit (INDEPENDENT_AMBULATORY_CARE_PROVIDER_SITE_OTHER): Payer: BLUE CROSS/BLUE SHIELD | Admitting: Rehabilitative and Restorative Service Providers"

## 2015-08-21 DIAGNOSIS — M545 Low back pain: Secondary | ICD-10-CM

## 2015-08-21 DIAGNOSIS — M2560 Stiffness of unspecified joint, not elsewhere classified: Secondary | ICD-10-CM

## 2015-08-21 DIAGNOSIS — R29898 Other symptoms and signs involving the musculoskeletal system: Secondary | ICD-10-CM | POA: Diagnosis not present

## 2015-08-21 DIAGNOSIS — Z7409 Other reduced mobility: Secondary | ICD-10-CM | POA: Diagnosis not present

## 2015-08-21 DIAGNOSIS — R531 Weakness: Secondary | ICD-10-CM

## 2015-08-21 DIAGNOSIS — M623 Immobility syndrome (paraplegic): Secondary | ICD-10-CM

## 2015-08-21 DIAGNOSIS — M79605 Pain in left leg: Principal | ICD-10-CM

## 2015-08-21 NOTE — Therapy (Signed)
[facility] [place] [address] [place], [place], [address] Phone: [phone]   Fax:  [phone]  Physical Therapy Treatment  Patient Details  Name: Catherine Hahn A Jaffer MRN: [id number] Date of Birth: 04/28/1971 Referring Provider: Dr. Casper HarrisonStreet  Encounter Date: 08/21/2015      PT End of Session - 08/21/15 1022    Visit Number 3   Number of Visits 12   Date for PT Re-Evaluation 09/18/15   PT Start Time 1015   PT Stop Time 1112   PT Time Calculation (min) 57 min   Activity Tolerance Patient tolerated treatment well      No past medical history on file.  Past Surgical History  Procedure Laterality Date  . Abdominal hysterectomy    . Hernia repair      There were no vitals filed for this visit.  Visit Diagnosis:  LBP radiating to left leg  Stiffness due to immobility  Weakness of left lower extremity  Decreased strength, endurance, and mobility      Subjective Assessment - 08/21/15 1021    Subjective patient reports some improvement since last visit. She has really been working on her posture at home and at work and working on how she moves and the positionshe rests in - using pillows to support LE.    Currently in Pain? Yes   Pain Score 2    Pain Location Back   Pain Orientation Lower;Left   Pain Descriptors / Indicators Nagging   Pain Type Chronic pain   Pain Onset More than a month ago                         [facility] Treatment/Exercise - 08/21/15 0001    Self-Care   Self-Care --  transitional movements sit to stand and reverse   Lumbar Exercises: Stretches   Passive Hamstring Stretch 3 reps;30 seconds   Press Ups --  2-3 sec hold x 10   Quad Stretch 3 reps;30 seconds  prone with strap    ITB Stretch 2 reps;30 seconds   Piriformis Stretch 2 reps;30 seconds   Lumbar Exercises: Aerobic   Stationary Bike Nustep L5 x 5 min    Lumbar Exercises: Supine   Ab Set --  3 part core 10 sec x  10 fatigued quickly    Bridge 5 reps  10 sec hold    Moist Heat Therapy   Number Minutes Moist Heat 15 Minutes   Moist Heat Location Lumbar Spine;Hip  Lt   Programme researcher, broadcasting/film/videolectrical Stimulation   Electrical Stimulation Location bilat L4/5 Lt hip/buttock    Electrical Stimulation Action IFC   Electrical Stimulation Parameters to tolerance   Electrical Stimulation Goals Pain;Tone   Manual Therapy   Manual therapy comments pt prone with pillow under trunk    Joint Mobilization Grade I/II Lumbar CPA    Soft tissue mobilization lumbar-thoracic musculature bilat Lt > Rt    Myofascial Release lumbar; Lt thoraco-lumbar                      PT Long Term Goals - 08/21/15 1023    PT LONG TERM GOAL #1   Title Improve trunk mobilty and ROM to 70% of normal range throughout 09/18/15   Time 6   Period Weeks   Status On-going   PT LONG TERM GOAL #2   Title Increased LE strength to 4+/5 to 5/5 throughout 09/18/15   Time 6  Period Weeks   Status On-going   PT LONG TERM GOAL #3   Title Increase functional activity tolerance allowing pt to sit/stand/walk 30-60 min without increased symptoms 09/18/15   Time 6   Period Weeks   Status On-going   PT LONG TERM GOAL #4   Title I in HEP 09/18/15   Time 6   Period Weeks   Status On-going   PT LONG TERM GOAL #5   Title Improve FOTO to </= 41% limitation 09/18/15   Time 6   Period Weeks   Status On-going               Plan - 08/21/15 1035    Clinical Impression Statement Jyll reports some improvement in LBP with improved posture and alignment. She is trying to be conscious of positions and transitional movement. She tolerated additional exercise without significant difficulty. Still has not aquired a TENS unit but is working on that. Gradual porgress toward goals of therapy.    Pt will benefit from skilled therapeutic intervention in order to improve on the following deficits Improper body mechanics;Postural dysfunction;Increased fascial  restricitons;Decreased range of motion;Decreased mobility;Decreased strength;Decreased endurance;Decreased activity tolerance;Pain   Rehab Potential Good   PT Frequency 2x / week   PT Duration 6 weeks   PT Treatment/Interventions Patient/family education;ADLs/Self Care Home Management;Therapeutic activities;Therapeutic exercise;Neuromuscular re-education;Manual techniques;Dry needling;Electrical Stimulation;Cryotherapy;Moist Heat;Ultrasound   PT Next Visit Plan hamstring and piriformis stretching; core stabilization; manual work including spinal mobs lumbar; modalities as indicated; consider TDN as indicated; continue spine care education    PT Home Exercise Plan spine care; HEP; TENS unit info   Consulted and Agree with Plan of Care Patient        Problem List There are no active problems to display for this patient.   Celyn Rober Minion PT,  MPH  08/21/2015, 10:59 AM  [facility] [address] [place], [place], [address] Phone: [phone]   Fax:  [phone]  Name: BETRICE WANAT MRN: [id number] Date of Birth: [birth year]

## 2015-08-21 NOTE — Patient Instructions (Signed)
Piriformis Stretch    Lying on back, pull right knee toward opposite shoulder. Hold __30__ seconds. Repeat __3__ times. Do _2-3___ sessions per day.    Outer Hip Stretch: Reclined IT Band Stretch (Strap)    Strap around opposite foot, pull across only as far as possible with shoulders on mat. Hold for _30 sec Repeat _3  times each leg. 2 times/day   Bridging    Slowly raise buttocks from floor, keeping stomach tight. Repeat __10__ times per set. Do _1-3___ sets per session. Do __2__ sessions per day.

## 2015-08-24 ENCOUNTER — Encounter: Payer: Self-pay | Admitting: Rehabilitative and Restorative Service Providers"

## 2015-08-24 ENCOUNTER — Ambulatory Visit (INDEPENDENT_AMBULATORY_CARE_PROVIDER_SITE_OTHER): Payer: Managed Care, Other (non HMO) | Admitting: Rehabilitative and Restorative Service Providers"

## 2015-08-24 DIAGNOSIS — M623 Immobility syndrome (paraplegic): Secondary | ICD-10-CM | POA: Diagnosis not present

## 2015-08-24 DIAGNOSIS — Z7409 Other reduced mobility: Secondary | ICD-10-CM

## 2015-08-24 DIAGNOSIS — R6889 Other general symptoms and signs: Secondary | ICD-10-CM

## 2015-08-24 DIAGNOSIS — R29898 Other symptoms and signs involving the musculoskeletal system: Secondary | ICD-10-CM | POA: Diagnosis not present

## 2015-08-24 DIAGNOSIS — M79605 Pain in left leg: Principal | ICD-10-CM

## 2015-08-24 DIAGNOSIS — M545 Low back pain: Secondary | ICD-10-CM | POA: Diagnosis not present

## 2015-08-24 DIAGNOSIS — R531 Weakness: Secondary | ICD-10-CM

## 2015-08-24 DIAGNOSIS — M2560 Stiffness of unspecified joint, not elsewhere classified: Secondary | ICD-10-CM

## 2015-08-24 NOTE — Patient Instructions (Signed)
Combination (Hook-Lying)    Tighten stomach and slowly raise left leg and lower opposite arm over head. Keep trunk rigid. Repeat __10__ times per set. Do __1-2__ sets per session. Do _1-2___ sessions per day.   Resisted External Rotation: in Neutral - Bilateral   PALMS UP Sit or stand, tubing in both hands, elbows at sides, bent to 90, forearms forward. Pinch shoulder blades together and rotate forearms out. Keep elbows at sides. Repeat __10__ times per set. Do _2-3___ sets per session. Do _2-3___ sessions per day.   Low Row: Standing   Face anchor, feet shoulder width apart. Palms up, pull arms back, squeezing shoulder blades together. Repeat 10__ times per set. Do 2-3__ sets per session. Do 2-3__ sessions per week. Anchor Height: Waist   Strengthening: Resisted Extension   Hold tubing in right hand, arm forward. Pull arm back, elbow straight. Repeat _10___ times per set. Do 2-3____ sets per session. Do 2-3____ sessions per day.

## 2015-08-24 NOTE — Therapy (Signed)
[facility] [place] [address] [place], [place], [address] Phone: [phone]   Fax:  [phone]  Physical Therapy Treatment  Patient Details  Name: Catherine Hahn MRN: [id number] Date of Birth: 05/21/1971 Referring Provider: Dr. Casper Harrison  Encounter Date: 08/24/2015      PT End of Session - 08/24/15 1023    Visit Number 4   Number of Visits 12   Date for PT Re-Evaluation 09/18/15   PT Start Time 1017   PT Stop Time 1115   PT Time Calculation (min) 58 min   Activity Tolerance Patient limited by pain      History reviewed. No pertinent past medical history.  Past Surgical History  Procedure Laterality Date  . Abdominal hysterectomy    . Hernia repair      There were no vitals filed for this visit.  Visit Diagnosis:  LBP radiating to left leg  Stiffness due to immobility  Weakness of left lower extremity  Decreased strength, endurance, and mobility      Subjective Assessment - 08/24/15 1021    Subjective Patient reports that she is improving. She has had some soreness from exercises but notices less pain. She has been doing her exercises at home    Currently in Pain? Yes   Pain Score 1    Pain Location Back   Pain Orientation Lower;Left   Pain Descriptors / Indicators Nagging   Pain Type Chronic pain   Pain Onset More than a month ago   Pain Frequency Intermittent                         [facility] Adult PT Treatment/Exercise - 08/24/15 0001    Lumbar Exercises: Stretches   Passive Hamstring Stretch 3 reps;30 seconds   Press Ups --  2-3 sec hold x 7 increased range   Quad Stretch 3 reps;30 seconds  prone with strap    ITB Stretch 2 reps;30 seconds   Piriformis Stretch 2 reps;30 seconds   Lumbar Exercises: Aerobic   Stationary Bike Nustep L5 x 5 min    Lumbar Exercises: Standing   Row Strengthening;Right;Left;10 reps;Theraband   Theraband Level (Row) Level 2 (Red)   Shoulder Extension  Strengthening;Right;Left;10 reps;Theraband   Other Standing Lumbar Exercises scapular retraction with noodle x10 red TB    Lumbar Exercises: Supine   Ab Set --  3 part core 10 sec x 10 fatigued quickly    Bridge 5 reps  10 sec hold    Other Supine Lumbar Exercises dead bug w/ core engaged alt arm/leg x 10    Moist Heat Therapy   Number Minutes Moist Heat 15 Minutes   Moist Heat Location Lumbar Spine;Hip  Lt   Programme researcher, broadcasting/film/video Location bilat L4/5 Lt hip/buttock    Electrical Stimulation Action IFC   Electrical Stimulation Parameters to tolerance   Electrical Stimulation Goals Pain;Tone   Manual Therapy   Manual therapy comments pt prone with pillow under trunk    Joint Mobilization Grade I/II Lumbar CPA    Soft tissue mobilization lumbar-thoracic musculature bilat Lt > Rt    Myofascial Release lumbar; Lt thoraco-lumbar                 PT Education - 08/24/15 1034    Education provided Yes   Education Details HEP   Person(s) Educated Patient   Methods Explanation;Demonstration;Tactile cues;Verbal cues;Handout   Comprehension Verbalized understanding;Returned demonstration;Verbal cues required;Tactile cues  required             PT Long Term Goals - 08/21/15 1023    PT LONG TERM GOAL #1   Title Improve trunk mobilty and ROM to 70% of normal range throughout 09/18/15   Time 6   Period Weeks   Status On-going   PT LONG TERM GOAL #2   Title Increased LE strength to 4+/5 to 5/5 throughout 09/18/15   Time 6   Period Weeks   Status On-going   PT LONG TERM GOAL #3   Title Increase functional activity tolerance allowing pt to sit/stand/walk 30-60 min without increased symptoms 09/18/15   Time 6   Period Weeks   Status On-going   PT LONG TERM GOAL #4   Title I in HEP 09/18/15   Time 6   Period Weeks   Status On-going   PT LONG TERM GOAL #5   Title Improve FOTO to </= 41% limitation 09/18/15   Time 6   Period Weeks   Status On-going                Plan - 08/24/15 1023    Clinical Impression Statement Good improvement in LBP and radicular sympotms. Patient demonstrates improved upright posture and gait pattern. good gait pattern without limp today. Progressing well toward stated goals of therapy.    Pt will benefit from skilled therapeutic intervention in order to improve on the following deficits Improper body mechanics;Postural dysfunction;Increased fascial restricitons;Decreased range of motion;Decreased mobility;Decreased strength;Decreased endurance;Decreased activity tolerance;Pain   Rehab Potential Good   PT Frequency 2x / week   PT Duration 6 weeks   PT Treatment/Interventions Patient/family education;ADLs/Self Care Home Management;Therapeutic activities;Therapeutic exercise;Neuromuscular re-education;Manual techniques;Dry needling;Electrical Stimulation;Cryotherapy;Moist Heat;Ultrasound   PT Next Visit Plan hamstring and piriformis stretching; core stabilization; manual work including spinal mobs lumbar; modalities as indicated; consider TDN as indicated; continue spine care education    PT Home Exercise Plan spine care; HEP; TENS unit info   Consulted and Agree with Plan of Care Patient        Problem List There are no active problems to display for this patient.   Dafina Suk Rober Minion Mozes Sagar PT, MPH  08/24/2015, 11:00 AM  [facility] [address] [place], [place], [address] Phone: [phone]   Fax:  [phone]  Name: Ardyth ManLisa A Albritton MRN: [id number] Date of Birth: 03/21/1971

## 2015-08-31 ENCOUNTER — Ambulatory Visit (INDEPENDENT_AMBULATORY_CARE_PROVIDER_SITE_OTHER): Payer: BLUE CROSS/BLUE SHIELD | Admitting: Physical Therapy

## 2015-08-31 DIAGNOSIS — M2560 Stiffness of unspecified joint, not elsewhere classified: Secondary | ICD-10-CM

## 2015-08-31 DIAGNOSIS — M79605 Pain in left leg: Principal | ICD-10-CM

## 2015-08-31 DIAGNOSIS — M545 Low back pain, unspecified: Secondary | ICD-10-CM

## 2015-08-31 DIAGNOSIS — Z7409 Other reduced mobility: Secondary | ICD-10-CM | POA: Diagnosis not present

## 2015-08-31 DIAGNOSIS — R531 Weakness: Secondary | ICD-10-CM

## 2015-08-31 DIAGNOSIS — R29898 Other symptoms and signs involving the musculoskeletal system: Secondary | ICD-10-CM | POA: Diagnosis not present

## 2015-08-31 DIAGNOSIS — M623 Immobility syndrome (paraplegic): Secondary | ICD-10-CM | POA: Diagnosis not present

## 2015-08-31 DIAGNOSIS — R6889 Other general symptoms and signs: Secondary | ICD-10-CM

## 2015-08-31 NOTE — Patient Instructions (Addendum)
Pelvic Press     Place hands under belly between navel and pubic bone, palms up. Feel pressure on hands. Increase pressure on hands by pressing pelvis down. This is NOT a pelvic tilt. Hold __5_ seconds. Relax. Repeat _10__ times. Once a day.  KNEE: Flexion - Prone   Hold pelvic press. Bend knee, then return the foot down. Repeat on opposite leg. Do not raise hips. _10__ reps per set. When this is mastered, pull both heels up at same time, x 10 reps.  Once a day   Leg Lift: One-Leg   Press pelvis down. Keep knee straight; lengthen and lift one leg (from waist). Do not twist body. Keep other leg down. Hold _1__ seconds. Relax. Repeat 10 time. Repeat with other leg. Repeat with the other leg.   HIP: Extension / KNEE: Flexion - Prone    Hold pelvic press. Bend knee, squeeze glutes. Raise leg up  10___ reps per set, _1__ sets, then repeat on the other leg.  _1__ time a day.   Axial Extension- Upper body sequence * always start with pelvic press    Lie on stomach with forehead resting on floor and arms at sides. Tuck chin in and raise head from floor without bending it up or down. Repeat ___10_ times per set. Do __1__ sets per session. Do _1___ sessions per day.  Progression:  Arms at side Arms in T shape Arms in W shape  Arms in M shape Arms in Y shape  [facility] [address] [place], [place] [address]  [phone] (office) [phone] (fax)   Trigger Point Dry Needling  . What is Trigger Point Dry Needling (DN)? o DN is a physical therapy technique used to treat muscle pain and dysfunction. Specifically, DN helps deactivate muscle trigger points (muscle knots).  o A thin filiform needle is used to penetrate the skin and stimulate the underlying trigger point. The goal is for a local twitch response (LTR) to occur and for the trigger point to relax. No medication of any kind is injected during the procedure.    . What Does Trigger Point Dry Needling Feel Like?  o The procedure feels different for each individual patient. Some patients report that they do not actually feel the needle enter the skin and overall the process is not painful. Very mild bleeding may occur. However, many patients feel a deep cramping in the muscle in which the needle was inserted. This is the local twitch response.   Marland Kitchen How Will I feel after the treatment? o Soreness is normal, and the onset of soreness may not occur for a few hours. Typically this soreness does not last longer than two days.  o Bruising is uncommon, however; ice can be used to decrease any possible bruising.  o In rare cases feeling tired or nauseous after the treatment is normal. In addition, your symptoms may get worse before they get better, this period will typically not last longer than 24 hours.   . What Can I do After My Treatment? o Increase your hydration by drinking more water for the next 24 hours. o You may place ice or heat on the areas treated that have become sore, however, do not use heat on inflamed or bruised areas. Heat often brings more relief post needling. o You can continue your regular activities, but vigorous activity is not recommended initially after the treatment for 24 hours. o DN is best combined with other  physical therapy such as strengthening, stretching, and other therapies.

## 2015-08-31 NOTE — Therapy (Signed)
[facility] [place] [address] [place], [place], [address] Phone: [phone]   Fax:  [phone]  Physical Therapy Treatment  Patient Details  Name: Catherine Hahn MRN: [id number] Date of Birth: 02/13/1971 Referring Provider: Dr Casper Harrison  Encounter Date: 08/31/2015      PT End of Session - 08/31/15 1152    Visit Number 5   Number of Visits 12   Date for PT Re-Evaluation 09/18/15   PT Start Time 1153   PT Stop Time 1249   PT Time Calculation (min) 56 min   Activity Tolerance Patient tolerated treatment well      No past medical history on file.  Past Surgical History  Procedure Laterality Date  . Abdominal hysterectomy    . Hernia repair      There were no vitals filed for this visit.  Visit Diagnosis:  LBP radiating to left leg  Stiffness due to immobility  Weakness of left lower extremity  Decreased strength, endurance, and mobility      Subjective Assessment - 08/31/15 1153    Subjective Catherine Hahn states she has been feeling really good this week, no pain today.    Patient Stated Goals to be able to walk and ewxercise; bend; move without pain    Currently in Pain? No/denies            [facility] PT Assessment - 08/31/15 0001    Assessment   Medical Diagnosis Chronic LBP    Referring Provider Dr Casper Harrison   Onset Date/Surgical Date 07/04/15   AROM   Lumbar Flexion mid shin   Lumbar Extension 15%   Lumbar - Right Rotation 10%   Lumbar - Left Rotation 10%   Strength   Left Hip Flexion --  5-/5   Left Hip Extension 4+/5   Left Hip ABduction --  5-/5                     [facility] Adult PT Treatment/Exercise - 08/31/15 0001    Lumbar Exercises: Stretches   Single Knee to Chest Stretch 10 seconds;3 reps   Double Knee to Chest Stretch 1 rep;10 seconds   Quadruped Mid Back Stretch --  cat/cow, 10reps    Lumbar Exercises: Aerobic   Stationary Bike Nustep L5 x 5 min    Lumbar Exercises: Prone   Other Prone Lumbar Exercises 10 reps pelvic press series, first 3 exercises only  VC for form,pt very fatigued after   Moist Heat Therapy   Number Minutes Moist Heat 15 Minutes   Moist Heat Location Lumbar Spine;Hip  Lt   Programme researcher, broadcasting/film/video Location bilat L4/5 Lt hip/buttock    Electrical Stimulation Action IFC   Electrical Stimulation Parameters to tolerance   Electrical Stimulation Goals Pain;Tone   Manual Therapy   Manual therapy comments pt prone with pillow under trunk    Joint Mobilization grade III CPA /UPA lumbar with focus on Lt UPA L4 articular pillar.    Soft tissue mobilization Lt lumbar paraspinals          Trigger Point Dry Needling - 08/31/15 1240    Consent Given? Yes   Education Handout Provided Yes   Muscles Treated Upper Body Longissimus  Ly   Longissimus Response Palpable increased muscle length;Twitch response elicited  still very tight              PT Education - 08/31/15 1205    Education provided Yes   Education Details  HEP& TDN   Person(s) Educated Patient   Methods Explanation;Handout   Comprehension Returned demonstration;Verbalized understanding             PT Long Term Goals - 08/31/15 1155    PT LONG TERM GOAL #1   Title Improve trunk mobilty and ROM to 70% of normal range throughout 09/18/15   Status On-going   PT LONG TERM GOAL #2   Title Increased LE strength to 4+/5 to 5/5 throughout 09/18/15   Status Partially Met   PT LONG TERM GOAL #3   Title Increase functional activity tolerance allowing pt to sit/stand/walk 30-60 min without increased symptoms 09/18/15   Status On-going  not attempted yet   PT LONG TERM GOAL #4   Title I in HEP 09/18/15   Status On-going   PT LONG TERM GOAL #5   Title Improve FOTO to </= 41% limitation 09/18/15   Status On-going               Plan - 08/31/15 1244    Clinical Impression Statement Catherine Hahn continues to have decreased pain and symptoms into her leg.  Her paraspinals are very tight and she is hypomobile in her lumbar vertebrae.  She has partially met one goal and progressing to the others.    Pt will benefit from skilled therapeutic intervention in order to improve on the following deficits Improper body mechanics;Postural dysfunction;Increased fascial restricitons;Decreased range of motion;Decreased mobility;Decreased strength;Decreased endurance;Decreased activity tolerance;Pain   Rehab Potential Good   PT Frequency 2x / week   PT Duration 6 weeks   PT Treatment/Interventions Patient/family education;ADLs/Self Care Home Management;Therapeutic activities;Therapeutic exercise;Neuromuscular re-education;Manual techniques;Dry needling;Electrical Stimulation;Cryotherapy;Moist Heat;Ultrasound   PT Next Visit Plan assess response to TDN and she may need more.    Consulted and Agree with Plan of Care Patient        Problem List There are no active problems to display for this patient.   Jeral Pinch PT 08/31/2015, 12:47 PM  [facility] [place], [place], [address] Phone: [phone]   Fax:  [phone]  Name: Catherine Hahn MRN: [id number] Date of Birth: 1970-07-05

## 2015-09-04 ENCOUNTER — Ambulatory Visit (INDEPENDENT_AMBULATORY_CARE_PROVIDER_SITE_OTHER): Payer: BLUE CROSS/BLUE SHIELD | Admitting: Rehabilitative and Restorative Service Providers"

## 2015-09-04 ENCOUNTER — Encounter: Payer: Self-pay | Admitting: Rehabilitative and Restorative Service Providers"

## 2015-09-04 DIAGNOSIS — M2560 Stiffness of unspecified joint, not elsewhere classified: Secondary | ICD-10-CM

## 2015-09-04 DIAGNOSIS — R29898 Other symptoms and signs involving the musculoskeletal system: Secondary | ICD-10-CM | POA: Diagnosis not present

## 2015-09-04 DIAGNOSIS — M545 Low back pain, unspecified: Secondary | ICD-10-CM

## 2015-09-04 DIAGNOSIS — M79605 Pain in left leg: Principal | ICD-10-CM

## 2015-09-04 DIAGNOSIS — Z7409 Other reduced mobility: Secondary | ICD-10-CM

## 2015-09-04 DIAGNOSIS — M623 Immobility syndrome (paraplegic): Secondary | ICD-10-CM

## 2015-09-04 DIAGNOSIS — R6889 Other general symptoms and signs: Secondary | ICD-10-CM

## 2015-09-04 DIAGNOSIS — R531 Weakness: Secondary | ICD-10-CM

## 2015-09-04 NOTE — Therapy (Signed)
[place] [address] [place], [place], [address] Phone: [phone]   Fax:  [phone]  Physical Therapy Treatment  Patient Details  Name: Catherine Hahn MRN: [id number] Date of Birth: [birth year] Referring Provider: dr. Venetia Maxon  Encounter Date: 09/04/2015      PT End of Session - 09/04/15 1015    Visit Number 6   Number of Visits 12   Date for PT Re-Evaluation 09/18/15   PT Start Time 1013   PT Stop Time 1109   PT Time Calculation (min) 56 min   Activity Tolerance Patient tolerated treatment well      History reviewed. No pertinent past medical history.  Past Surgical History  Procedure Laterality Date  . Abdominal hysterectomy    . Hernia repair      There were no vitals filed for this visit.  Visit Diagnosis:  LBP radiating to left leg  Stiffness due to immobility  Weakness of left lower extremity  Decreased strength, endurance, and mobility      Subjective Assessment - 09/04/15 1016    Subjective Catherine Hahn reports that she was sore after the TDN but did well with TENS unit and stretching. She thinks the TDN "helped that muscle get softer"  No pain - doing her exercises at home now and can tell a difference.    Currently in Pain? No/denies            [facility] PT Assessment - 09/04/15 0001    Assessment   Medical Diagnosis Chronic LBP    Referring Provider dr. Venetia Maxon   Onset Date/Surgical Date 07/04/15   Hand Dominance Left   Next MD Visit no return scheduled    Prior Therapy PT 2015    AROM   Lumbar Flexion 70%   Lumbar Extension 20%   Lumbar - Right Side Bend 20%   Lumbar - Left Side Bend 25%   Strength   Left Hip Flexion --  5-/5   Left Hip Extension 4+/5   Left Hip ABduction --  5-/5   Flexibility   Hamstrings Rt 75 deg; Lt 70 deg   Quadriceps heel ~ 2 inches from buttock Lt 1 inch Rt less pull    ITB tight bilat Lt > Rt    Piriformis tight Lt    Palpation   Palpation comment  improved muscular tightness through Lt paraspinals; QL; lats; glut medius; piriformis                      [facility] Adult PT Treatment/Exercise - 09/04/15 0001    Lumbar Exercises: Stretches   Passive Hamstring Stretch 3 reps;30 seconds   Single Knee to Chest Stretch 10 seconds;3 reps   Double Knee to Chest Stretch 3 reps;10 seconds  hips abducted to increased stretch x 2    Press Ups --  2-3 reps x 10 increasing range    Quadruped Mid Back Stretch --  cat/cow, 5 reps    Quad Stretch 3 reps;30 seconds  prone with strap    Piriformis Stretch 2 reps;30 seconds   Lumbar Exercises: Aerobic   Stationary Bike Nustep L6 x 5 min    Lumbar Exercises: Supine   Ab Set --  3 part core 10 sec x 10    Bridge 5 reps  10 sec hold with core engaged    Straight Leg Raise 5 reps  core engaged PT resisting multiple planes for motor control    Lumbar Exercises: Prone  Other Prone Lumbar Exercises 10 reps pelvic press series, first 3 exercises only  VC for form,pt very fatigued after   Moist Heat Therapy   Number Minutes Moist Heat 15 Minutes   Moist Heat Location Lumbar Spine;Hip  Lt   Acupuncturist Location bilat L4/5 Lt hip/buttock    Electrical Stimulation Action IFC   Electrical Stimulation Parameters to tolerance    Electrical Stimulation Goals Pain;Tone   Manual Therapy   Manual therapy comments pt prone with pillow under trunk    Joint Mobilization grade III CPA /UPA lumbar with focus on Lt UPA L4 articular pillar.    Soft tissue mobilization Lt lumbar paraspinals   Myofascial Release lumbar; Lt thoraco-lumbar                 PT Education - 09/04/15 1025    Education provided Yes   Education Details HEP    Person(s) Educated Patient   Methods Explanation   Comprehension Verbalized understanding             PT Long Term Goals - 09/04/15 1029    PT LONG TERM GOAL #1   Title Improve trunk mobilty and ROM to 70% of normal  range throughout 09/18/15   Time 6   Period Weeks   Status On-going   PT LONG TERM GOAL #2   Title Increased LE strength to 4+/5 to 5/5 throughout 09/18/15   Time 6   Period Weeks   Status Partially Met   PT LONG TERM GOAL #3   Title Increase functional activity tolerance allowing pt to sit/stand/walk 30-60 min without increased symptoms 09/18/15   Time 6   Period Weeks   Status On-going  walked 10 min without pain    PT LONG TERM GOAL #4   Title I in HEP 09/18/15   Time 6   Period Weeks   Status On-going   PT LONG TERM GOAL #5   Title Improve FOTO to </= 41% limitation 09/18/15   Time 6   Period Weeks   Status On-going               Plan - 09/04/15 1027    Clinical Impression Statement Symptoms continue to improve with less pain and improved mobilty thorugh lumbar spine and LE's. Continue tighness noted through lumbar spine and musculatrure. progressing well toward stated goals of therapy.    Pt will benefit from skilled therapeutic intervention in order to improve on the following deficits Improper body mechanics;Postural dysfunction;Increased fascial restricitons;Decreased range of motion;Decreased mobility;Decreased strength;Decreased endurance;Decreased activity tolerance;Pain   Rehab Potential Good   PT Frequency 2x / week   PT Duration 6 weeks   PT Treatment/Interventions Patient/family education;ADLs/Self Care Home Management;Therapeutic activities;Therapeutic exercise;Neuromuscular re-education;Manual techniques;Dry needling;Electrical Stimulation;Cryotherapy;Moist Heat;Ultrasound   PT Next Visit Plan continue with lumbar stabilization; manual work; TDN as indicated    PT Home Exercise Plan spine care; HEP; TENS unit info   Consulted and Agree with Plan of Care Patient        Problem List There are no active problems to display for this patient.   [place], MPH  09/04/2015, 11:00 AM  [facility] [place]  [address] [place], [place], [address] Phone: [phone]   Fax:  [phone]  Name: Catherine Hahn MRN: [id number] Date of Birth: 10-02-1970

## 2015-09-07 ENCOUNTER — Encounter: Payer: Managed Care, Other (non HMO) | Admitting: Rehabilitative and Restorative Service Providers"

## 2015-09-11 ENCOUNTER — Ambulatory Visit (INDEPENDENT_AMBULATORY_CARE_PROVIDER_SITE_OTHER): Payer: BLUE CROSS/BLUE SHIELD | Admitting: Physical Therapy

## 2015-09-11 ENCOUNTER — Encounter (INDEPENDENT_AMBULATORY_CARE_PROVIDER_SITE_OTHER): Payer: Self-pay

## 2015-09-11 DIAGNOSIS — M545 Low back pain, unspecified: Secondary | ICD-10-CM

## 2015-09-11 DIAGNOSIS — R29898 Other symptoms and signs involving the musculoskeletal system: Secondary | ICD-10-CM | POA: Diagnosis not present

## 2015-09-11 DIAGNOSIS — M79605 Pain in left leg: Secondary | ICD-10-CM

## 2015-09-11 NOTE — Patient Instructions (Signed)
.   Abdominal Bracing With Pelvic Floor (Hook-Lying)   With neutral spine, tighten pelvic floor and abdominals. Hold 5 seconds. Repeat __10_ times. Do _1__ times a day.   Knee to Chest: Transverse Plane Stability   Bring one knee up, then return. Be sure pelvis does not roll side to side. Keep pelvis still. Lift knee __10_ times each leg. Restabilize pelvis. Repeat with other leg. Do _1-2__ sets, _1__ times per day.   Hip External Rotation With Pillow: Transverse Plane Stability   One knee bent, one leg straight, on pillow. Slowly roll bent knee out. Be sure pelvis does not rotate. Do _10__ times. Restabilize pelvis. Repeat with other leg. Do _1-2__ sets, _1__ times per day.  Heel Slide: 4-10 Inches - Transverse Plane Stability   Slide heel 4 inches down. Be sure pelvis does not rotate. Do _10__ times. Restabilize pelvis. Repeat with other leg. Do __1-2_ sets, _1__ times per day.   [facility] [address] [place], [place] [address]  [phone] (office) [phone] (fax)

## 2015-09-11 NOTE — Therapy (Signed)
[place] [address] [place], [place], [address] Phone: [phone]   Fax:  [phone]  Physical Therapy Treatment  Patient Details  Name: Catherine Hahn MRN: [id number] Date of Birth: 02/10/1971 Referring Provider: Dr Venetia Maxon  Encounter Date: 09/11/2015      PT End of Session - 09/11/15 1149    Visit Number 7   Number of Visits 12   Date for PT Re-Evaluation 09/18/15   PT Start Time 1151   PT Stop Time 1230   PT Time Calculation (min) 39 min      No past medical history on file.  Past Surgical History  Procedure Laterality Date  . Abdominal hysterectomy    . Hernia repair      There were no vitals filed for this visit.      Subjective Assessment - 09/11/15 1150    Subjective Catherine Hahn states the needling really did the trick, she did over do it yesterday, walked alot without good shoes. Feeling better today. She is pleased with her progress and doesn't feel like she needs more therapy.    Currently in Pain? No/denies            [facility] PT Assessment - 09/11/15 0001    Assessment   Medical Diagnosis Chronic LBP    Referring Provider Dr Venetia Maxon   Onset Date/Surgical Date 07/04/15   Hand Dominance Left   Observation/Other Assessments   Focus on Therapeutic Outcomes (FOTO)  44% limited   AROM   Lumbar Flexion 95%   Lumbar Extension 50%   Lumbar - Right Side Bend 80%   Lumbar - Left Side Bend 80%   Lumbar - Right Rotation 75%   Lumbar - Left Rotation 75%   Strength   Right Hip Flexion 5/5   Right Hip Extension 5/5   Right Hip ADduction 5/5   Left Hip Flexion 5/5   Left Hip Extension 5/5   Left Hip ABduction --  5-/5                     [facility] Adult PT Treatment/Exercise - 09/11/15 0001    Lumbar Exercises: Stretches   ITB Stretch 2 reps;30 seconds  cross body with strap   Lumbar Exercises: Supine   Ab Set 10 reps;5 seconds   Clam 20 reps   Bent Knee Raise 20 reps   Straight Leg  Raise 20 reps   Lumbar Exercises: Quadruped   Madcat/Old Horse 10 reps   Opposite Arm/Leg Raise Left arm/Right leg;Right arm/Left leg;10 reps                PT Education - 09/11/15 1204    Education provided Yes   Education Details HEP   Person(s) Educated Patient   Methods Handout;Explanation   Comprehension Returned demonstration             PT Long Term Goals - 09/11/15 1152    PT LONG TERM GOAL #1   Title Improve trunk mobilty and ROM to 70% of normal range throughout 09/18/15   Status Partially Met  all directions except extension 50%    PT LONG TERM GOAL #2   Title Increased LE strength to 4+/5 to 5/5 throughout 09/18/15   Status Achieved   PT LONG TERM GOAL #3   Title Increase functional activity tolerance allowing pt to sit/stand/walk 30-60 min without increased symptoms 09/18/15   Status Achieved   PT LONG TERM GOAL #4   Title I  in HEP 09/18/15   Status Achieved   PT LONG TERM GOAL #5   Title Improve FOTO to </= 41% limitation 09/18/15   Status Not Met  scored 44% limited, progressing             Patient will benefit from skilled therapeutic intervention in order to improve the following deficits and impairments:     Visit Diagnosis: Weakness of left lower extremity  LBP radiating to left leg     Problem List There are no active problems to display for this patient.   Jeral Pinch PT 09/11/2015, 12:35 PM  [facility] [place] [address], [place], [address] Phone: [phone]   Fax:  [phone]  Name: Catherine Hahn MRN: [id number] Date of Birth: Jul 22, 1970   PHYSICAL THERAPY DISCHARGE SUMMARY  Visits from Start of Care: 7  Current functional level related to goals / functional outcomes: Sew above   Remaining deficits: Pt doing well, able to return to the gym and will progress herself slowly.    Education / Equipment: HEP Plan: Patient agrees to discharge.   Patient goals were partially met. Patient is being discharged due to being pleased with the current functional level.  ?????    Jeral Pinch, PT 09/11/2015 12:36 PM
